# Patient Record
Sex: Male | Born: 1935 | Race: White | Hispanic: No | Marital: Married
Health system: Southern US, Community
[De-identification: ages and names within clinical notes are randomized; demographics above are authoritative.]

---

## 2004-02-28 ENCOUNTER — Emergency Department: Payer: Self-pay | Admitting: Emergency Medicine

## 2004-03-27 ENCOUNTER — Other Ambulatory Visit: Payer: Self-pay

## 2004-03-27 ENCOUNTER — Emergency Department: Payer: Self-pay | Admitting: Internal Medicine

## 2004-09-28 ENCOUNTER — Ambulatory Visit: Payer: Self-pay | Admitting: Unknown Physician Specialty

## 2005-06-28 ENCOUNTER — Emergency Department: Payer: Self-pay | Admitting: Emergency Medicine

## 2006-02-25 ENCOUNTER — Other Ambulatory Visit: Payer: Self-pay

## 2006-02-25 ENCOUNTER — Inpatient Hospital Stay: Payer: Self-pay | Admitting: General Surgery

## 2006-03-19 ENCOUNTER — Emergency Department: Payer: Self-pay | Admitting: Emergency Medicine

## 2006-03-19 ENCOUNTER — Other Ambulatory Visit: Payer: Self-pay

## 2006-04-07 ENCOUNTER — Ambulatory Visit: Payer: Self-pay | Admitting: General Surgery

## 2007-07-26 IMAGING — CT CT ABD-PELV W/ CM
1 of 3 series · 13 of 32 positions shown, 19 images · non-contrast
Comparison: none

REASON FOR EXAM: LLQ pain
COMMENTS:

PROCEDURE:     CT  - CT ABDOMEN / PELVIS  W  - March 20, 2006 [DATE]
RESULT:     The patient is experiencing LEFT lower quadrant pain.
The report was faxed to the Emergency Room.

[Series 2: abd/pelvis · axial · 0.92mm/px · z∈[-488,-68]mm · 13 of 164 slices shown, 19 images]
[im 12/164  soft-tissue]
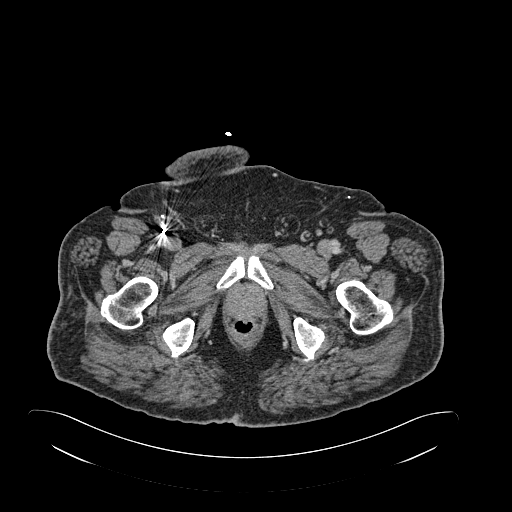
[im 12/164  bone]
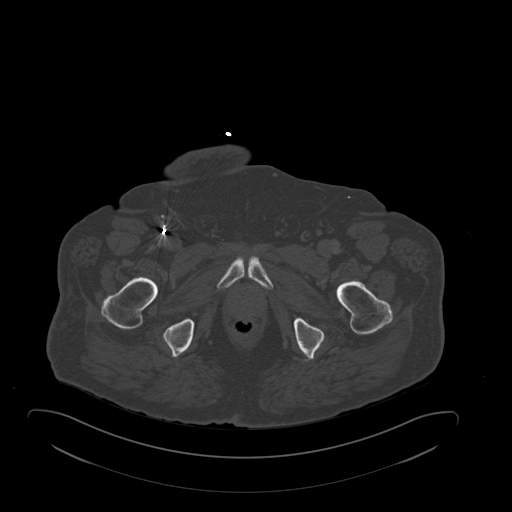
[im 24/164  soft-tissue]
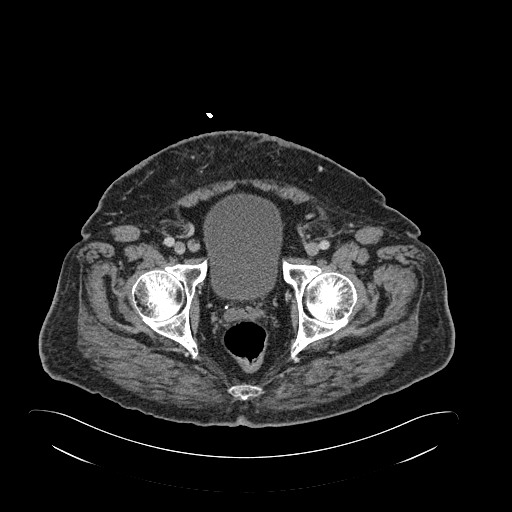
[im 35/164  soft-tissue]
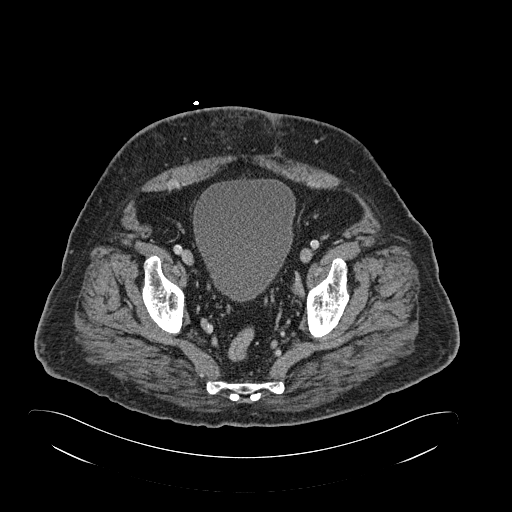
[im 47/164  soft-tissue]
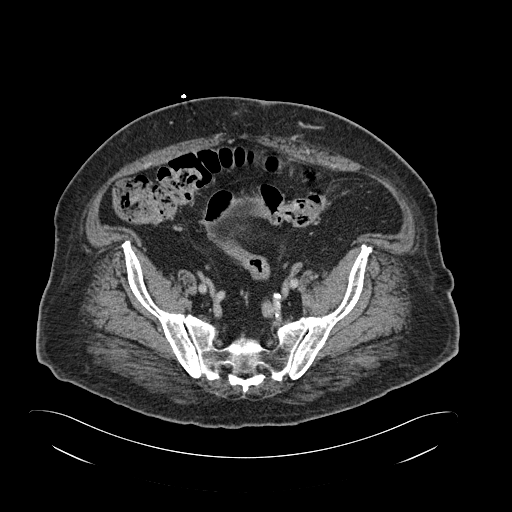
[im 59/164  soft-tissue]
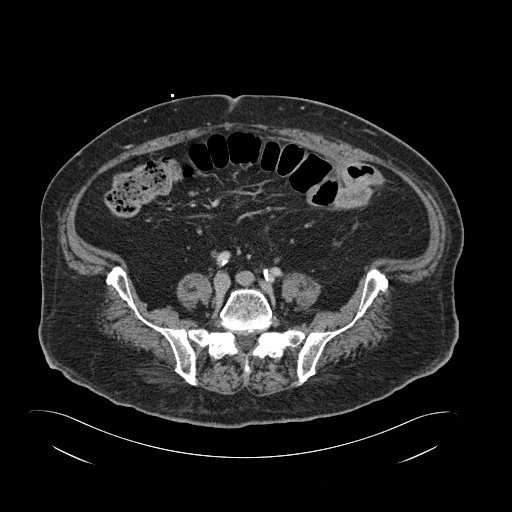
[im 70/164  soft-tissue]
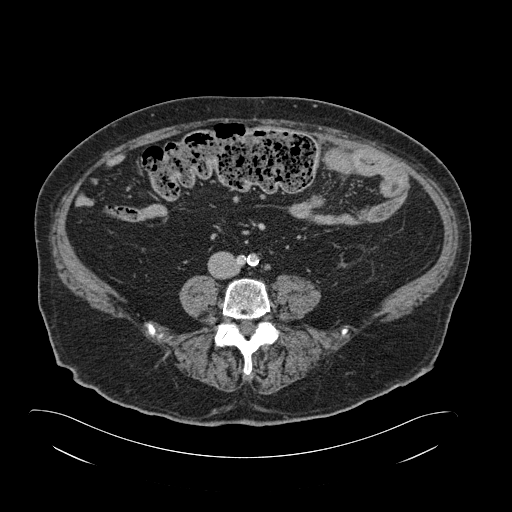
[im 82/164  soft-tissue]
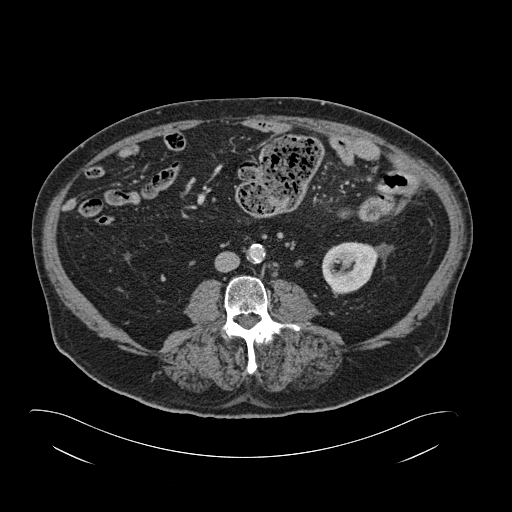
[im 94/164  soft-tissue]
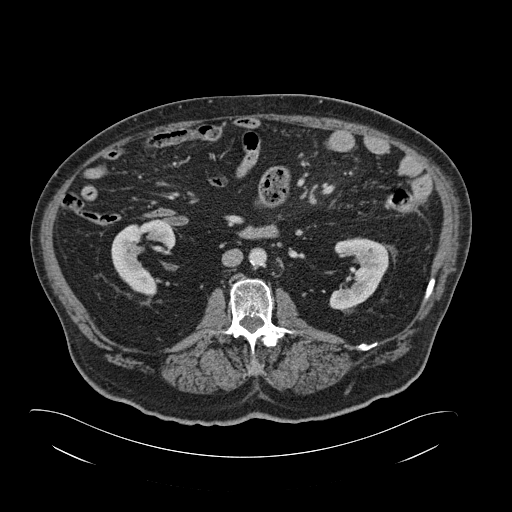
[im 105/164  soft-tissue]
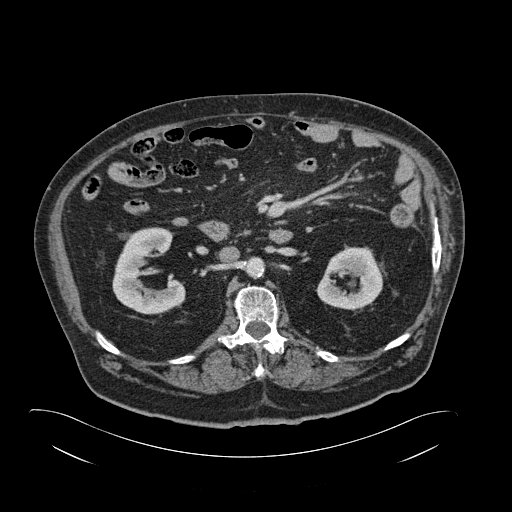
[im 105/164  bone]
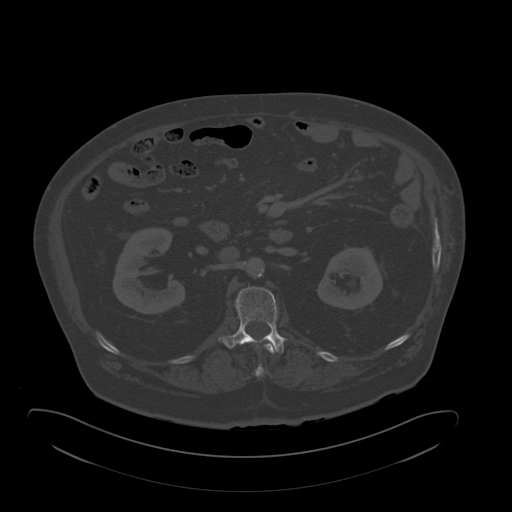
[im 117/164  soft-tissue]
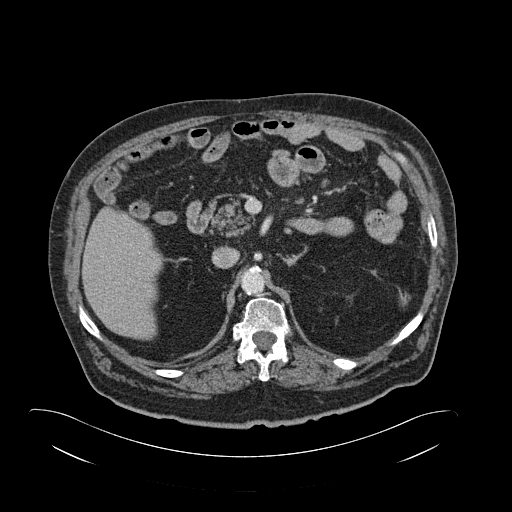
[im 117/164  lung]
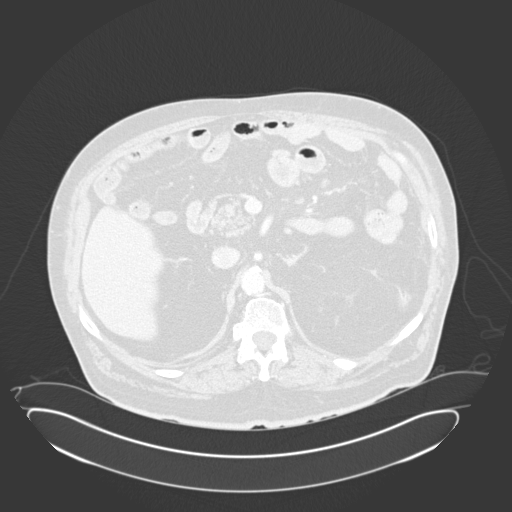
[im 129/164  soft-tissue]
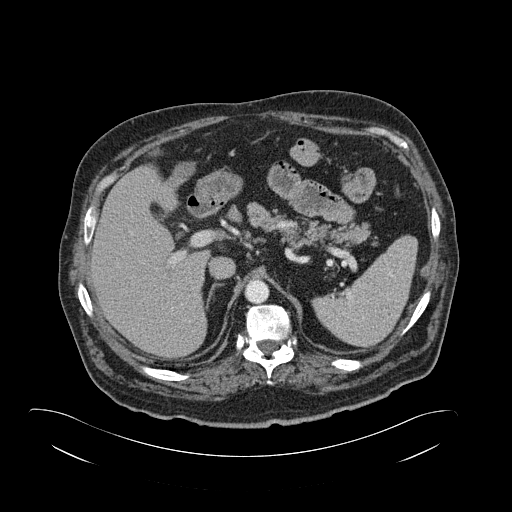
[im 129/164  lung]
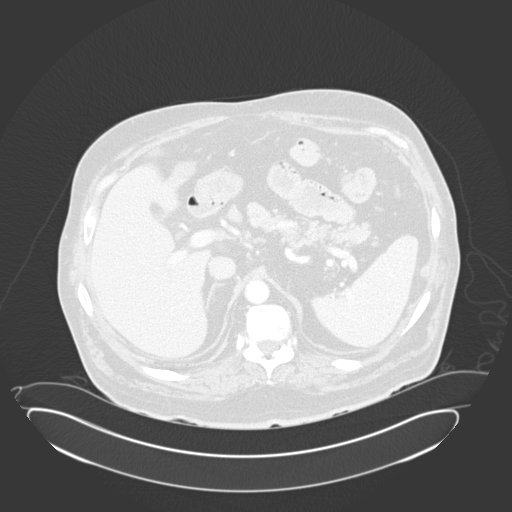
[im 140/164  soft-tissue]
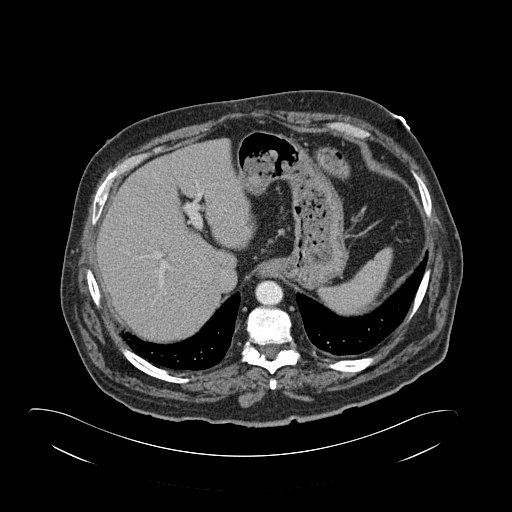
[im 140/164  lung]
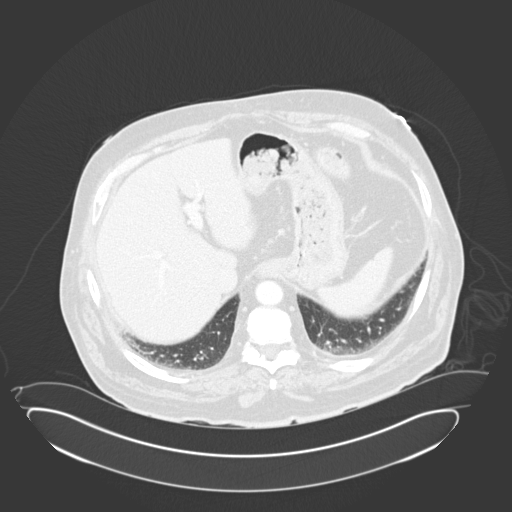
[im 152/164  soft-tissue]
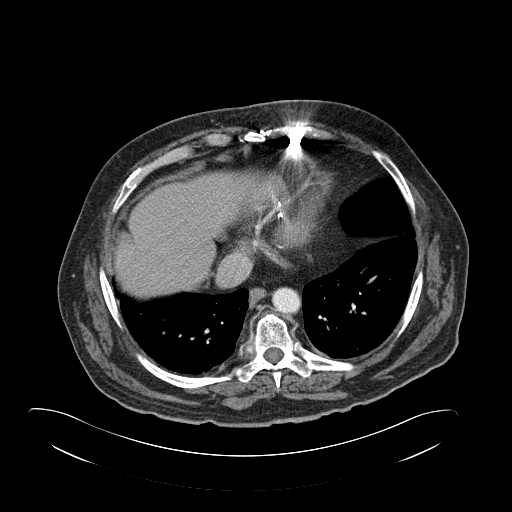
[im 152/164  lung]
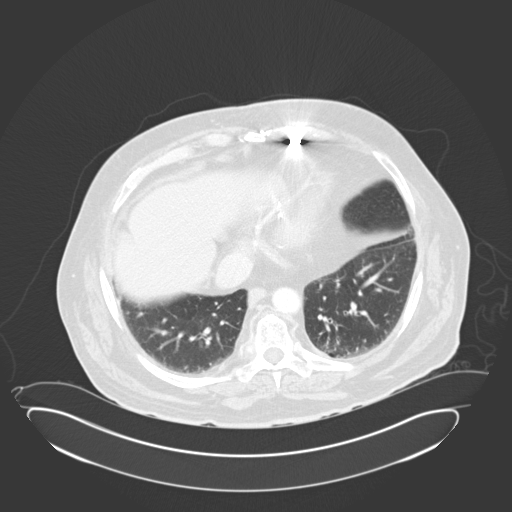

[13 of 32 positions shown; findings below may reference images not displayed]

FINDINGS: On today's study when compared to 03/02/2006, no intra-abdominal
abscesses are identified. There are noted gallstones as seen previously. The
liver and spleen appear intact. No focal masses are noted. Both kidneys
excrete the contrast material with no masses noted in either kidney. No
hydronephrosis is identified. There are noted two, tiny cysts in the lower
pole of the RIGHT kidney. No ascites is present in the abdomen or pelvis.

The images through the lung bases reveal the lung bases to be clear with no
effusions present.
IMPRESSION: 1.     No intra-abdominal abscesses are identified when compared to
03/02/2006.
2.     No definite diverticulitis or appendicitis is noted.
3.     Incidentally noted, gallstones are present.

## 2007-12-07 ENCOUNTER — Emergency Department: Payer: Self-pay | Admitting: Unknown Physician Specialty

## 2009-03-15 ENCOUNTER — Inpatient Hospital Stay: Payer: Self-pay | Admitting: Student

## 2009-06-03 ENCOUNTER — Emergency Department: Payer: Self-pay | Admitting: Emergency Medicine

## 2010-02-26 ENCOUNTER — Ambulatory Visit: Payer: Self-pay | Admitting: Geriatric Medicine

## 2010-07-30 ENCOUNTER — Inpatient Hospital Stay: Payer: Self-pay | Admitting: Internal Medicine

## 2010-12-25 ENCOUNTER — Ambulatory Visit: Payer: Self-pay | Admitting: Geriatric Medicine

## 2011-01-18 ENCOUNTER — Ambulatory Visit: Payer: Self-pay | Admitting: Internal Medicine

## 2011-02-13 ENCOUNTER — Other Ambulatory Visit: Payer: Self-pay | Admitting: Geriatric Medicine

## 2011-02-14 ENCOUNTER — Inpatient Hospital Stay: Payer: Self-pay | Admitting: Internal Medicine

## 2011-02-18 ENCOUNTER — Ambulatory Visit: Payer: Self-pay | Admitting: Internal Medicine

## 2011-02-18 LAB — CBC WITH DIFFERENTIAL/PLATELET
Basophil #: 0 10*3/uL (ref 0.0–0.1)
Basophil %: 0 %
Eosinophil #: 0.4 10*3/uL (ref 0.0–0.7)
Eosinophil %: 2.7 %
HCT: 33.5 % — ABNORMAL LOW (ref 40.0–52.0)
HGB: 10.7 g/dL — ABNORMAL LOW (ref 13.0–18.0)
Lymphocyte #: 0.8 10*3/uL — ABNORMAL LOW (ref 1.0–3.6)
Lymphocyte %: 5.5 %
MCH: 25.9 pg — ABNORMAL LOW (ref 26.0–34.0)
MCHC: 31.9 g/dL — ABNORMAL LOW (ref 32.0–36.0)
MCV: 81 fL (ref 80–100)
Monocyte #: 0.6 10*3/uL (ref 0.0–0.7)
Monocyte %: 4.1 %
Neutrophil #: 12.7 10*3/uL — ABNORMAL HIGH (ref 1.4–6.5)
Neutrophil %: 87.7 %
Platelet: 279 10*3/uL (ref 150–440)
RBC: 4.11 10*6/uL — ABNORMAL LOW (ref 4.40–5.90)
RDW: 20.5 % — ABNORMAL HIGH (ref 11.5–14.5)
WBC: 14.5 10*3/uL — ABNORMAL HIGH (ref 3.8–10.6)

## 2011-02-18 LAB — BASIC METABOLIC PANEL
Anion Gap: 9 (ref 7–16)
BUN: 22 mg/dL — ABNORMAL HIGH (ref 7–18)
EGFR (Non-African Amer.): >60
Glucose: 293 mg/dL — ABNORMAL HIGH (ref 65–99)
Osmolality: 308 (ref 275–301)
Potassium: 3.8 mmol/L (ref 3.5–5.1)
Sodium: 148 mmol/L — ABNORMAL HIGH (ref 136–145)

## 2011-02-18 LAB — HEPATIC FUNCTION PANEL A (ARMC)
Alkaline Phosphatase: 78 U/L (ref 50–136)
Bilirubin,Total: 0.4 mg/dL (ref 0.2–1.0)
SGPT (ALT): 40 U/L
Total Protein: 5 g/dL — ABNORMAL LOW (ref 6.4–8.2)

## 2011-02-18 LAB — SODIUM
Sodium: 148 mmol/L — ABNORMAL HIGH (ref 136–145)
Sodium: 149 mmol/L — ABNORMAL HIGH (ref 136–145)

## 2011-02-19 LAB — COMPREHENSIVE METABOLIC PANEL
Alkaline Phosphatase: 77 U/L (ref 50–136)
BUN: 19 mg/dL — ABNORMAL HIGH (ref 7–18)
Bilirubin,Total: 0.5 mg/dL (ref 0.2–1.0)
Co2: 30 mmol/L (ref 21–32)
Creatinine: 1.02 mg/dL (ref 0.60–1.30)
EGFR (African American): >60
EGFR (Non-African Amer.): >60
Glucose: 125 mg/dL — ABNORMAL HIGH (ref 65–99)
Osmolality: 296 (ref 275–301)
Potassium: 3.7 mmol/L (ref 3.5–5.1)
SGOT(AST): 50 U/L — ABNORMAL HIGH (ref 15–37)
SGPT (ALT): 43 U/L
Total Protein: 5.2 g/dL — ABNORMAL LOW (ref 6.4–8.2)

## 2011-02-19 LAB — CBC WITH DIFFERENTIAL/PLATELET
Basophil #: 0 10*3/uL (ref 0.0–0.1)
Basophil %: 0 %
HCT: 33.6 % — ABNORMAL LOW (ref 40.0–52.0)
Lymphocyte #: 1.1 10*3/uL (ref 1.0–3.6)
MCH: 25.6 pg — ABNORMAL LOW (ref 26.0–34.0)
MCHC: 31.3 g/dL — ABNORMAL LOW (ref 32.0–36.0)
MCV: 82 fL (ref 80–100)
Monocyte %: 3.8 %
Neutrophil #: 14.3 10*3/uL — ABNORMAL HIGH (ref 1.4–6.5)
Neutrophil %: 85.6 %
Platelet: 285 10*3/uL (ref 150–440)
RDW: 21.5 % — ABNORMAL HIGH (ref 11.5–14.5)
WBC: 16.7 10*3/uL — ABNORMAL HIGH (ref 3.8–10.6)

## 2011-02-19 LAB — SODIUM
Sodium: 152 mmol/L — ABNORMAL HIGH (ref 136–145)
Sodium: 149 mmol/L — ABNORMAL HIGH (ref 136–145)

## 2011-02-20 LAB — SODIUM: Sodium: 152 mmol/L — ABNORMAL HIGH (ref 136–145)

## 2011-02-21 LAB — CBC WITH DIFFERENTIAL/PLATELET
Basophil #: 0 10*3/uL (ref 0.0–0.1)
Basophil %: 0.3 %
Eosinophil #: 0.4 10*3/uL (ref 0.0–0.7)
HGB: 10.9 g/dL — ABNORMAL LOW (ref 13.0–18.0)
Lymphocyte %: 9.9 %
MCHC: 31.8 g/dL — ABNORMAL LOW (ref 32.0–36.0)
MCV: 82 fL (ref 80–100)
Monocyte #: 0.8 10*3/uL — ABNORMAL HIGH (ref 0.0–0.7)
Monocyte %: 7.2 %
Neutrophil #: 9.3 10*3/uL — ABNORMAL HIGH (ref 1.4–6.5)
Neutrophil %: 79.2 %
RDW: 21.6 % — ABNORMAL HIGH (ref 11.5–14.5)

## 2011-03-21 ENCOUNTER — Ambulatory Visit: Payer: Self-pay | Admitting: Internal Medicine

## 2011-04-12 ENCOUNTER — Inpatient Hospital Stay: Payer: Self-pay | Admitting: Internal Medicine

## 2011-04-12 LAB — CBC
HCT: 36.8 % — ABNORMAL LOW (ref 40.0–52.0)
HGB: 11.7 g/dL — ABNORMAL LOW (ref 13.0–18.0)
MCH: 27.3 pg (ref 26.0–34.0)
MCHC: 31.7 g/dL — ABNORMAL LOW (ref 32.0–36.0)
Platelet: 228 10*3/uL (ref 150–440)

## 2011-04-12 LAB — COMPREHENSIVE METABOLIC PANEL
Albumin: 2.9 g/dL — ABNORMAL LOW (ref 3.4–5.0)
Alkaline Phosphatase: 68 U/L (ref 50–136)
Anion Gap: 9 (ref 7–16)
Bilirubin,Total: 0.3 mg/dL (ref 0.2–1.0)
Calcium, Total: 8.6 mg/dL (ref 8.5–10.1)
Co2: 35 mmol/L — ABNORMAL HIGH (ref 21–32)
Glucose: 116 mg/dL — ABNORMAL HIGH (ref 65–99)
Osmolality: 288 (ref 275–301)
SGOT(AST): 20 U/L (ref 15–37)
SGPT (ALT): 14 U/L

## 2011-04-12 LAB — CK TOTAL AND CKMB (NOT AT ARMC)
CK, Total: 42 U/L (ref 35–232)
CK, Total: 40 U/L (ref 35–232)
CK-MB: 1.5 ng/mL (ref 0.5–3.6)
CK-MB: 1.3 ng/mL (ref 0.5–3.6)

## 2011-04-12 LAB — TROPONIN I
Troponin-I: 0.1 ng/mL — ABNORMAL HIGH
Troponin-I: 0.09 ng/mL — ABNORMAL HIGH
Troponin-I: 0.05 ng/mL

## 2011-04-12 LAB — PRO B NATRIURETIC PEPTIDE: B-Type Natriuretic Peptide: 2117 pg/mL — ABNORMAL HIGH (ref 0–450)

## 2011-04-13 LAB — BASIC METABOLIC PANEL
BUN: 19 mg/dL — ABNORMAL HIGH (ref 7–18)
Chloride: 92 mmol/L — ABNORMAL LOW (ref 98–107)
EGFR (African American): >60
EGFR (Non-African Amer.): >60
Glucose: 155 mg/dL — ABNORMAL HIGH (ref 65–99)
Osmolality: 279 (ref 275–301)
Potassium: 3.6 mmol/L (ref 3.5–5.1)
Sodium: 137 mmol/L (ref 136–145)

## 2011-04-13 LAB — PRO B NATRIURETIC PEPTIDE: B-Type Natriuretic Peptide: 3787 pg/mL — ABNORMAL HIGH (ref 0–450)

## 2011-04-13 LAB — LIPID PANEL
Cholesterol: 157 mg/dL (ref 0–200)
Ldl Cholesterol, Calc: 109 mg/dL — ABNORMAL HIGH (ref 0–100)
VLDL Cholesterol, Calc: 19 mg/dL (ref 5–40)

## 2011-04-13 LAB — HEMOGLOBIN A1C: Hemoglobin A1C: 7.2 % — ABNORMAL HIGH (ref 4.2–6.3)

## 2011-04-14 LAB — BASIC METABOLIC PANEL
Anion Gap: 10 (ref 7–16)
BUN: 20 mg/dL — ABNORMAL HIGH (ref 7–18)
Calcium, Total: 8.8 mg/dL (ref 8.5–10.1)
Co2: 38 mmol/L — ABNORMAL HIGH (ref 21–32)
EGFR (African American): >60
EGFR (Non-African Amer.): >60
Glucose: 97 mg/dL (ref 65–99)
Potassium: 3.2 mmol/L — ABNORMAL LOW (ref 3.5–5.1)
Sodium: 140 mmol/L (ref 136–145)

## 2011-04-16 LAB — POTASSIUM: Potassium: 3.7 mmol/L (ref 3.5–5.1)

## 2011-04-17 LAB — CULTURE, BLOOD (SINGLE)

## 2011-04-18 ENCOUNTER — Ambulatory Visit: Payer: Self-pay | Admitting: Internal Medicine

## 2011-07-19 DEATH — deceased

## 2014-06-11 NOTE — Consult Note (Signed)
PATIENT NAME:  Riley [name], Riley [name] MR#:  [id number] DATE OF BIRTH:  [birth year]  DATE OF CONSULTATION:  02/16/2011  CONSULTING PHYSICIAN:  Claude Manges, MD  REASON FOR CONSULTATION: Gallstones.   HISTORY OF PRESENT ILLNESS: Riley [name] is a 81 year old white male who suffers from a severe mentally handicapped state and schizophrenia, who was admitted to the hospital two days ago for what appears to be a pneumonia in the left lower lobe associated with hypotension, worsening renal failure, and significant leukocytosis. He was found to have gallstones on CT scan, and therefore I was consulted. Initial systolic blood pressure was 80, and this responded to fluid; and it was initially believed that he may have a urinary tract infection. However, urine culture is no growth after 36 hours. He also has two blood cultures that are no growth after 48 hours. His pneumonia has been treated with Zyvox and Zosyn until goals of care can be determined as he lives in a nursing home and his family has been difficult to contact. White blood cell count has been 24,000 and decreased to 21,000 today. His SGOT was 70 on admission and SGPT was 118 on admission, but bilirubin and alkaline phosphatase were both normal. Today his SGOT is down to 92, and his SGPT is 73. His bilirubin remains normal at 0.4. Alkaline phosphatase remains normal at 94, and his albumin is markedly diminished at 1.8. CT scan of the chest without contrast was obtained on admission on the 28th, and this showed left lower lobe pneumonia as well as cholelithiasis. Ultrasound of the gallbladder today showed gallstones with a normal common bile duct (3 mm), no gallbladder wall thickening, pericholecystic fluid, or sonographic Murphy's sign. Thus, there was no evidence of acute cholecystitis.   PAST MEDICAL HISTORY:  1. Coronary artery disease, status post myocardial infarction and coronary artery bypass graft.  2. Hypertension.  3. Dyslipidemia.  4. Diabetes.   5. Schizophrenia.  6. Severe mentally handicapped state.  7. Dysphagia.  8. Skin cancer.  9. Peripheral edema.  10. Gastroesophageal reflux disease.  11. Moderate-to-severe aortic stenosis.  12. Severe lumbar stenosis.  13. History of partial small bowel obstruction.   ALLERGIES: None.   CURRENT MEDICATIONS: Tylenol, Percocet, DuoNebs, aspirin, Rocephin, chlorpromazine, Plavix, codeine, dextromethorphan, guaifenesin, phenylephrine, diazepam, Lasix, glipizide, Imdur, metformin, nitroglycerin, prednisone, verapamil.   SOCIAL HISTORY: The patient resides at [facility] He does not smoke cigarettes, use alcohol, or any drugs, according to the medical record.   FAMILY HISTORY: Noncontributory.   REVIEW OF SYSTEMS: Review of systems cannot be obtained as the patient is nonverbal.   PHYSICAL EXAMINATION:  GENERAL: An obtunded elderly gentleman lying in the bed with multiple small ulcers about his face, and his mouth open and unresponsive. Height 5 feet 8 inches, weight 174 pounds, BMI 26.5.   VITAL SIGNS: Temperature 97.6, pulse 76, respirations 18, blood pressure 110/62, oxygen saturation 97% on room air.   HEENT: Normocephalic, atraumatic. His oral mucous membranes are extremely dry. His pupils are equal and reactive.   NECK: Supple with no jugular venous distention. The trachea is midline, and there is no thyroid tenderness.   HEART: Regular rate and rhythm with no murmurs or rubs.   LUNGS: Clear to auscultation with normal respiratory effort bilaterally.   ABDOMEN: Slightly protuberant but not distended, with positive bowel sounds and no tenderness, peritoneal signs, or hepatosplenomegaly.   EXTREMITIES: Significant lower extremity edema with delayed capillary refill.   NEUROLOGIC: Examination cannot be performed, but the  patient does appear to be able to move all four extremities.   PSYCHIATRIC: Examination cannot be performed as the patient is nonverbal.   ASSESSMENT:  Asymptomatic cholelithiasis with no indication of acute cholecystitis or symptoms from his gallstones.   PLAN: Given that the patient has severe or at least moderate aortic stenosis and takes Plavix and aspirin chronically, his Plavix would have to be discontinued for at least five days (preferably seven), and his aspirin would have to be discontinued for seven days; and he would require a Cardiology clearance for cholecystectomy due to his aortic stenosis. I do not believe that the patient's gallbladder is contributing to any of his symptoms and believe that the best course of action would be to establish goals of care with the assistance of the Palliative Care consult that you have already obtained. It appears that the risk of cholecystectomy would outweigh the benefits in this patient.   Thank you very much for this consultation.   ____________________________ Claude MangesWilliam F. Nayra Coury, MD wfm:cbb D: 02/16/2011 14:23:53 ET T: 02/16/2011 14:49:11 ET JOB#: [id number]  cc: Claude MangesWilliam F. Yides Saidi, MD, <Dictator> Claude MangesWILLIAM F Rayshell Goecke MD ELECTRONICALLY SIGNED 02/19/2011 14:18

## 2014-06-11 NOTE — Discharge Summary (Signed)
PATIENT NAME:  Riley [name], Riley [name] MR#:  [id number] DATE OF BIRTH:  01/16/1933  DATE OF ADMISSION:  2011-02-24 DATE OF DISCHARGE:  02/21/2011  ADMITTING DIAGNOSIS: Leukocytosis.   DISCHARGE DIAGNOSES:  1. Left lower lobe bacterial pneumonia of unclear etiology at this time.  2. Leukocytosis likely due to bacterial pneumonia, resolving.  3. Elevated transaminases, resolved, of unclear etiology.  4. Cholelithiasis, no acute cholecystitis.  5. Acute renal failure, likely dehydration related, resolved.  6. Dehydration.  7. Hyponatremia, hypokalemia.  8. Anemia.  9. Diabetes mellitus.  10. Severe protein calorie malnutrition.  11. Failure to thrive, adult.  12. Severe aortic stenosis.  13. Dysphagia.  14. Obesity.  15. History of coronary artery disease, status post myocardial infarction in the past, and bypass.  16. History of hypertension.  17. History of skin cancer.  18. History of lower extremity swelling. 19. Severe lumbar stenosis.  20. History of partial small bowel obstruction.  21. Schizophrenia.  22. Mentally handicapped.  23. History of hyperlipidemia.   DISCHARGE CONDITION: Guarded.   DISCHARGE MEDICATIONS: (The patient is to resume his outpatient medications which include the following) 1. Diazepam 5 mg p.o. twice daily. 2. Albuterol ipratropium 2.5/0.5 mg in 3 mL solution one vial, 3 mL, via nebulizer four times daily.  3. Chlorpromazine 25 mg twice daily.  4. Aspirin 81 mg p.o. daily.  5. Plavix 75 mg p.o. daily.  6. Glipizide 7.5 mg p.o. daily.  7. Sliding scale insulin.  8. Tylenol 750 mg p.o. every four hours as needed.  9. Protonix 40 mg p.o. daily.  10. Zofran 4 mg p.o. every six hours as needed.  11. Robitussin-AC 10 mL every four hours as needed.   HOME OXYGEN: None.   DIET: Pureed with thickened liquids, honey thick liquids. No straws. Medications in puree. Add gravy to foods to moisten and flavor. Tray set-up at meals and feedings.  ACTIVITY  LIMITATIONS: As tolerated.  REFERRALS: Hospice.  DISCHARGE FOLLOWUP: Followup with Dr. Glenetta Borg in 2 days after discharge.   CONSULTANTS: 1. Speech Therapy. 2. Ned Grace, MD - Care Management. 3. Duwaine Maxin, MD  RADIOLOGIC STUDIES: Chest x-ray one view on 02/24/11:  Findings suggesting atelectasis at lung bases. This may be superimposed upon findings of low-grade congestive heart failure. Follow-up PA and lateral would be of value, according to the radiologist.  CT scan of the chest without contrast on 02/24/2011: Small left pleural effusion. There is left lower lobe airspace disease which may represent atelectasis versus pneumonia. Cholelithiasis.   Ultrasound of kidneys bilaterally on Feb 24, 2011: Right renal cyst, otherwise normal renal ultrasound.  Ultrasound of abdomen, limited survey, on 02/16/2011: Cholelithiasis without sonographic evidence of acute cholecystitis.   Echocardiogram on 02/16/2011: Left ventricular systolic function is normal. Ejection fraction 55%. There is mild mitral regurgitation. There is mild tricuspid regurgitation and severe valvular aortic stenosis noted.   HISTORY OF PRESENT ILLNESS: The patient is a 81 year old Caucasian male with history of schizophrenia, history of diabetes, hypertension, coronary artery disease, and aortic stenosis who presented to the hospital from his facility because of leukocytosis. Apparently he was diagnosed with left-sided pneumonia and was started on antibiotics; however, he was noted to have also renal insufficiency as well as significant leukocytosis and because he was not doing better with antibiotic therapy for 24 hours, the decision was made to transfer him for further evaluation and management, to [facility]: [facility]   On arrival to the emergency room, the patient's vitals showed a pulse of  101, initial temperature was 96.8, respiration rate 20, blood pressure 90/62, and oxygen saturation was  94% on room air. He was also intermittently hypotensive with systolic blood pressures running around 70s to 80s to 90s. Physical examination showed bibasilar crackles.   LABS/STUDIES: B-type natriuretic peptide 2586. Glucose 352, BUN and creatinine 63 and 2.56, and sodium 155. The patient's liver enzymes showed elevation of AST as well as ALT to 86 and 92, respectively, and albumin level of 2.2. The patient's CK total was elevated at 791; however, MB fraction as well as troponin were within normal limits. White blood cell count was significantly elevated to 24.7, hemoglobin was 13.6, and platelets 289.   Blood cultures taken on 02/14/2011 showed no growth.   Influenza was also negative.   Urine cultures were also negative despite urine showing 38 white blood cells as well as 3 red blood cells and 2+ leukocyte esterase, as well as 1+ bacteria.   HOSPITAL COURSE: The patient was admitted to the hospital. Because of being in a facility, he was started on a combination of antibiotics with Zyvox as well as Zosyn. His chest x-ray done on 02/14/2011 showed atelectasis at lung bases as well as possible low-grade congestive heart failure.  In regards to bacterial pneumonia, as mentioned above, the patient was initiated on Zyvox as well as Zosyn IV. With this therapy his condition significantly improved. His white blood cell count improved to 11.8 on the day of discharge, 02/21/2011. The patient received seven days of antibiotic therapy with Zosyn and Zyvox for his pneumonia, which is targeted antibiotic course. The patient did not exhibit any signs however of pneumonia itself, no hypoxia, and no significant cough or shortness of breath. It was felt that the patient's pneumonia as well as leukocytosis resolved with antibiotic therapy.   The patient was noted to have elevated AST as well as ALT on admission. His CT showed cholelithiasis. Ultrasound of the abdomen also revealed cholelithiasis; however, no  cholecystitis. The patient was evaluated by surgeon, Dr. Anda KraftMarterre, who saw the patient in consultation on 02/16/2011. He felt however that the patient is at high risk for any kind of surgery including abdominal cholecystectomy especially in view of severe cardiac disease and if the patient needs therapy he would need to have Plavix as well as aspirin discontinued prior to surgery anyway. He did not feel in fact that the patient's gallbladder was contributing to any of his symptoms and recommended to get assistance from palliative care to establish goals of care. Dr. Harvie JuniorPhifer was consulted for palliative care and discussed the patient's care with his family. Because of lack of improvement in the patient's course, the decision was made to discharge the patient back to skilled nursing facility with hospice care.  Initially the patient was noted to have severe hyponatremia with sodium level of 155, as mentioned above, as well as acute renal failure. With IV fluid administration as well as holding his Lasix, the patient's kidney function improved. Already on 02/17/2011 the patient's creatinine was 1.25. However, the patient's sodium level remained still elevated and was very difficult to control despite D5 water given for him IV. The patient's sodium level fluctuated over a period of time. The lowest level seen here in the hospital was around 147. Today, on the day of discharge, 02/21/2011, the patient's sodium level is 152. It is recommended, if the patient's family desires, to follow the patient's sodium level as an outpatient. However, as the patient now will be under hospice care,  unlikely this would be feasible. It was felt that the patient's hyponatremia is dehydration related. However because of his diabetes mellitus leading him to dehydration as well as poor oral intake and dysphagia requiring thickened liquids, it will not be easy to achieve a normalized sodium level. Mover over the patient was volume overloaded  at some point requiring one dose of Lasix while in the hospital. Fluid overload was likely mild congestive heart failure related to severe aortic stenosis, diastolic dysfunction. The patient was evaluated by a dietitian while in the hospital and calorie count was obtained. The patient had less than 50% of his protein as well as calorie intake and it was felt that the patient is failing to thrive.   For diabetes mellitus, the patient's metformin was discontinued because of propensity to dehydration and acute renal failure. However, the patient is to continue glipizide at the same dose as he previously was taking as well as sliding scale insulin. The patient had discomfort in his abdomen apparently upon arrival to the hospital, however, he was not able to explain the discomfort and it was unclear if it was at all related to any gallstones or gastritis; however, the patient was initiated on Protonix and his discomfort resolved. He denied anymore significant problems with his abdomen during all his stay in the hospital time.   The patient is being discharged in stable condition with the above-mentioned medications and follow-up. His vitals on the day of discharge showed a temperature of 97.6, pulse 98, respiration rate 18, blood pressure 112/76, and saturation 98% on room air at rest.   TIME SPENT: 40 minutes. ____________________________ Katharina Caper, MD rv:slb D: 02/21/2011 13:51:00 ET T: 02/21/2011 14:39:26 ET JOB#: [id number]  cc: Katharina Caper, MD, <Dictator> Glenetta Borg, MD Dezi Brauner MD ELECTRONICALLY SIGNED 03/17/2011 7:28

## 2014-06-11 NOTE — Consult Note (Signed)
PATIENT NAME:  Riley [name], Riley [name] MR#:  [id number] DATE OF BIRTH:  07-07-1932  DATE OF CONSULTATION:  04/12/2011  REFERRING PHYSICIAN:  Dr. Maryellen [name]  CONSULTING PHYSICIAN:  Riley [name]. Riley Catala, MD  INDICATION: Shortness of breath and murmur, elevated troponins.   HISTORY OF PRESENT ILLNESS: Mr. Riley [name] is a 81 year old white male with history of multiple medical problems including coronary artery disease, severe aortic valve stenosis, congestive heart failure, diabetes, schizophrenia, hypertension who presented with worsening symptoms of shortness of breath. Patient also reports that he had some chest pain as well. He has been not communicating as well because he has a speech impediment but does indicate shortness of breath over the last five months or so. He was here back in December and got treated and evaluated. At that time was found to have severe aortic stenosis. He presents this time with recurrent congestive heart failure symptoms, decompensation with elevated troponins. Cardiology was recommended for further evaluation.   REVIEW OF SYSTEMS: No blackout spells. No syncope. No nausea, vomiting. Denies fever, chills, sweats. No weight loss, no weight gain. No hemoptysis, hematemesis. No bright red blood per rectum. He has had shortness of breath, fatigue, cough. No sputum.   PAST MEDICAL HISTORY:  1. Coronary artery disease. 2. Aortic valve stenosis. 3. Cholelithiasis.  4. Anemia.  5. Diabetes.  6. Failure to thrive. 7. Schizophrenia. 8. Hypertension.  9. Obesity.  10. Lumbar spine stenosis.  11. Partial bowel obstruction. 12. Hyperlipidemia. 13. Mental retardation.   PAST SURGICAL HISTORY: Coronary artery bypass surgery.   FAMILY HISTORY: Heart disease.   SOCIAL HISTORY: He lives in a nursing home. Remote history of smoking. No alcohol consumption.   MEDICATIONS:  1. Glipizide 7.5 daily.  2. Plavix 75 a day.  3. Oxygen 2 liters nasal cannula.  4. Zofran 4 mg every six hours p.r.n.   5. Delsym p.r.n.  6. Aspirin 81 mg a day. 7. Chlorpromazine 25 mg twice a day.  8. Valium 5 mg twice a day.  9. DuoNebs 4 times a day.  10. Protonix 40 mg a day. 11. Mucinex ER 600 twice a day.   PHYSICAL EXAMINATION:  VITAL SIGNS: Blood pressure 120/70, pulse 90 and regular, respiratory rate 24, afebrile.   HEENT: Normocephalic, atraumatic. Pupils equal, reactive to light.   NECK: Supple with mild bilateral jugular venous distention.   LUNGS: Bilateral rhonchi, rales in the bases. Adequate air movement. No wheezing.   HEART: Regular rate, rhythm. Loud systolic ejection murmur at 3/6 along the apex and left sternal border.   ABDOMEN: Benign.   EXTREMITIES: Within normal limits.  NEUROLOGICAL: Intact.   SKIN: Normal.   LABORATORY, DIAGNOSTIC AND RADIOLOGICAL DATA: Chest x-ray history of coronary artery bypass grafting with sternotomy, congestive heart failure. EKG: Sinus rhythm, rate of 90, nonspecific ST-T wave changes. Glucose 116. BNP 2117. BUN 13, creatinine 0.7, sodium 144, potassium 4, troponin elevated at 0.1. CK 42. LFTs are normal. CBC 9000, hemoglobin 11.7, hematocrit 36, platelet count 228.   ASSESSMENT:  1. Congestive heart failure. 2. Shortness of breath. 3. Aortic stenosis.  4. Elevated troponins, possible non-Q-wave myocardial infarction.  5. Anemia.  6. Hypertension.  7. Diabetes. 8. Coronary artery disease.  9. Spinal stenosis. 10. Obesity. 11. Schizophrenia.  12. Hyperlipidemia.  13. Failure to thrive.  PLAN: Agree with admit. Rule out for myocardial infarction. Follow up troponins. Follow up cardiac enzymes. Follow up EKG. Lasix therapy and oxygen therapy to help with volume. Repeat echocardiogram for aortic stenosis. Continue blood  pressure control. Continue with diabetes control. Agree with continue lipid management. Deep vein thrombosis prophylaxis. Do not recommend long-term anticoagulation at this point. Recommended conservative cardiology  approach. Do not recommend invasive catheterization or valve replacement because of comorbid disease.  ____________________________ Bobbie Stack. Juliann Pares, MD ddc:cms [name]: 04/15/2011 18:47:00 ET T: 04/16/2011 08:13:37 ET JOB#: [id number]  cc: Dani Wallner [name]. Juliann Pares, MD, <Dictator> Alwyn Pea MD ELECTRONICALLY SIGNED 05/06/2011 10:05

## 2014-06-11 NOTE — Consult Note (Signed)
Chief Complaint:   Subjective/Chief Complaint Pt states to be doing about the same . He states that his sob is better than when he was admitted. No cp.   VITAL SIGNS/ANCILLARY NOTES: **Vital Signs.:   25-Feb-13 18:02   Vital Signs Type Routine   Temperature Temperature (F) 97.6   Celsius 36.4   Temperature Source oral   Pulse Pulse 88   Pulse source per Telemetry Clerk   Respirations Respirations 20   Systolic BP Systolic BP 865   Diastolic BP (mmHg) Diastolic BP (mmHg) 68   Mean BP 80   BP Source Dinamap   Pulse Ox % Pulse Ox % 96   Pulse Ox Activity Level  At rest   Oxygen Delivery 2L  *Intake and Output.:   25-Feb-13 17:30   Grand Totals Intake:  360 Output:      Net:  360 24 Hr.:  1440   Oral Intake      In:  360   Percentage of Meal Eaten  100   Brief Assessment:   Cardiac Regular  murmur present  + LE edema  + JVD  --Gallop    Respiratory normal resp effort  rhonchi    Gastrointestinal Normal    Gastrointestinal details normal Soft  Nontender  Nondistended  Bowel sounds normal  No gaurding   Routine Chem:  25-Feb-13 01:48    Glucose, Serum 97   BUN 20   Creatinine (comp) 0.94   Sodium, Serum 140   Potassium, Serum 3.2   Chloride, Serum 92   CO2, Serum 38   Calcium (Total), Serum 8.8   Osmolality (calc) 282   eGFR (African American) >60   eGFR (Non-African American) >60   Anion Gap 10   Magnesium, Serum 1.8  Blood Glucose:  25-Feb-13 07:16    POCT Blood Glucose 140    11:05    POCT Blood Glucose 269    16:11    POCT Blood Glucose 212    19:33    POCT Blood Glucose 233   Radiology Results: XRay:    23-Feb-13 03:12, Chest Portable Single View   Chest Portable Single View    REASON FOR EXAM:    Shortness of Breath  COMMENTS:       PROCEDURE: DXR - DXR PORTABLE CHEST SINGLE VIEW  - Apr 12 2011  3:12AM     RESULT: Comparison: 02/14/2011    Findings:  The heart and mediastinum are stable. Prior median sternotomy and CABG.   There mild  basilar opacities, left greater than right. Possible small   left pleural effusion.    IMPRESSION:   1. Mild bibasilar opacities may be related to mild pulmonary edema or   atelectasis. Infection is not excluded. Followup chest radiographsare     suggested.  2. Small left pleural effusion.          Verified By: Gregor Hams, M.D., MD    25-Feb-13 08:00, Chest PA and Lateral   Chest PA and Lateral    REASON FOR EXAM:    f/u CHF/pul edema  COMMENTS:       PROCEDURE: DXR - DXR CHEST PA (OR AP) AND LATERAL  - Apr 14 2011  8:00AM     RESULT: Comparison is made to the prior exam of 04/12/2011. There is a   hazy increase in density at the left base consistent with a small left   pleural effusion. The pulmonary vascularity appears slightly less   prominent than was evident on  the exam of 04/12/2011. The heart is upper   limits for normal in size and appears stable in size as compared to the   prior exam. Postoperative changes of prior CABG are again noted.    IMPRESSION:   1. There is increased density at the left base suspicious for a trace   left effusion.  2. The lung fields otherwise are clear.  3. The heart is upper limits for normal in size but appears stable in   size as compared to the prior exam.  4. Postoperative changes of prior CABG are again noted.  5. In the lateral view, there is thickening of the left pleural shadow   posteriorly compatible with fibrosis or recurrent effusion and which was   also present on the exam of 07/31/2010.    Thank you for the opportunity to contribute to the care of your patient.           Verified By: Dionne Ano WALL, M.D., MD  Cardiology:    23-Feb-13 01:10, ED ECG   Ventricular Rate 97   Atrial Rate 97   P-R Interval 158   QRS Duration 86   QT 370   QTc 469   P Axis 23   R Axis -5   T Axis -85   ECG interpretation    Normal sinus rhythm  Minimal voltage criteria for LVH, may be normal variant  ST & T wave abnormality,  consider anterolateral ischemia  Prolonged QT  Abnormal ECG  When compared with ECG of 29-Jul-2010 19:50,  Non-specific change in ST segment in Anterior leads  Nonspecific T wave abnormality now evident in Inferior leads  T wave inversion now evident in Anterolateral leads  ----------unconfirmed----------  Confirmed by OVERREAD, NOT ([id number]), editor PEARSON, BARBARA ([id number]) on 04/14/2011 8:39:24 AM   ED ECG     23-Feb-13 15:28, Echo Doppler   Echo Doppler    Interpretation Summary    The left ventricle is moderately dilated. There is no thrombus. Left   ventricular systolic function is moderately reduced. Ejection   Fraction = 35-45%. There is normal left ventricular wall thickness.   There is moderateglobal hypokinesis of the left ventricle. The   right ventricle is not well visualized. Borderline right ventricular   enlargement. The right ventricular systolic function is mildly   reduced. Severe valvular aortic stenosis. Mild aortic regurgitation.    PatientHeight: 180 cm    PatientWeight: 102 kg    BSA: 2.2 m2    Procedure:    A two-dimensional transthoracic echocardiogram with color flow and   Doppler was performed.    The study was completed  bedside.    Left Ventricle    Left ventricularsystolic function is moderately reduced.    Ejection Fraction = 35-45%.    There is moderate global hypokinesis of the left ventricle.    The left ventricle is moderately dilated.    There is no thrombus.    There is normal left ventricular wall thickness.    Right Ventricle    The right ventricular systolic function is mildly reduced.    The right ventricle is not well visualized.    Borderline right ventricular enlargement.    There is normal right ventricular wall thickness.    Atria    Borderline left atrial enlargement.    Borderline right atrial enlargement.    Mitral Valve    The mitral valve leaflets appear thickened, but open well.    There is mild mitral  regurgitation.  Tricuspid Valve    The tricuspid valve is not well visualized, but is grossly normal.    There is mild to moderate tricuspid regurgitation.    Aortic Valve    Severe valvular aortic stenosis.    There is discrete nodular thickening of the non- coronary cusp.    Mild aortic regurgitation.    Pulmonic Valve    The pulmonic valve is not well visualized.    There is no pulmonic valvular regurgitation.    Great Vessels    The aortic root is not well visualized but is probably normal size.    Pericardium/Pleural    There is no pleural effusion.    No pericardial effusion.    MMode 2D Measurements and Calculations    IVSd: 1.6 cm    LVIDd: 4.9 cm    LVIDs: 3.3 cm    LVPWd: 1.4 cm    FS: 32 %    EF(Teich): 60 %    LVOT diam: 1.8 cm    LVLd ap4: 9.6 cm    EDV(MOD-sp4): 88 ml    LVLs ap4: 7.1 cm    ESV(MOD-sp4): 51 ml    EF(MOD-sp4): 42 %    SV(MOD-sp4): 37 ml    Doppler Measurements and Calculations    MV E point: 61 cm/sec    MV A point: 101 cm/sec    MV E/A: 0.61     MV P1/2t max vel: 82 cm/sec    MV P1/2t: 77 msec    MVA(P1/2t): 2.9 cm2    MV dec slope: 313 cm/sec2    MV dec time: 0.23 sec    Ao V2 max: 406 cm/sec    Ao max PG: 64 mmHg    Ao V2 mean: 285 cm/sec    Ao mean PG: 38 mmHg    Ao V2 VTI: 95 cm    AVA(I,D): 0.42 cm2    AVA(V,D): 0.51 cm2    AI max vel: 262 cm/sec    AI max PG: 27 mmHg    AI dec slope: 138 cm/sec2    AI P1/2t: 556 msec    LV max PG: 3.0 mmHg    LV mean PG: 1.1 mmHg    LV V1 max: 81 cm/sec    LV V1 mean: 46 cm/sec    LV V1 VTI: 16 cm    MR max vel: 549 cm/sec    MR max PG: 121 mmHg    SV(LVOT): 40 ml    PA V2 max: 125 cm/sec    PA max PG: 6.0 mmHg    PA acc time: 0.05 sec    PI end-d vel: 72 cm/sec    TR Max vel: 220 cm/sec    TR Max PG: 19 mmHg    RVSP: 24 mmHg    RAP systole: 5.0 mmHg    PA pr(Accel): 55 mmHg    Reading Physician: Lujean Amel   Sonographer: Jaci Standard  Interpreting Physician:   Lujean Amel,  electronically signed on   04-13-2011 16:53:59  Requesting Physician: Lujean Amel   Assessment/Plan:  Invasive Device Daily Assessment of Necessity:   Does the patient currently have any of the following indwelling devices? none   Assessment/Plan:   Assessment IMP CHF NQMI AS HTN DM CAD Schizophenia Weakness Murmur Fatigue .    Plan PLAN 02 Lasix Bp control DVT prophylasix Continue DM control F/U Ekgs PT/OT Conservative cardiology care Agree with palliative care I do not rec AovR We could consider Core valve study at [facility]?  Electronic Signatures: Lujean Amel D (MD)  (Signed [phone] 14:22)  Authored: Chief Complaint, VITAL SIGNS/ANCILLARY NOTES, Brief Assessment, Lab Results, Radiology Results, Assessment/Plan   Last Updated: 26-Feb-13 14:22 by Yolonda Kida (MD)

## 2014-06-11 NOTE — H&P (Signed)
PATIENT NAME:  Riley [name], LORENSON MR#:  [id number] DATE OF BIRTH:  10-Dec-1932  DATE OF ADMISSION:  02/14/2011  REFERRING PHYSICIAN: Dr. Marilynne Halsted.    PRIMARY CARE PHYSICIAN:  Glenetta Borg, MD  CHIEF COMPLAINT:  Leukocytosis.    HISTORY OF PRESENT ILLNESS: The patient is a 81 year old Caucasian male with history of coronary artery disease, status post MI and CABG, hypertension, hyperlipidemia, diabetes, schizophrenia, mentally handicapped who was transferred from [facility] today after persistent leukocytosis. Per records and staff, the patient was started on IV antibiotics yesterday for presumed left-sided pneumonia. He had acute renal insufficiency yesterday with creatinine of 1.8 as well as leukocytosis of 20,000. Today as he did not get better, and WBC still had trended up he was transferred here for further evaluation and management. Here he was found without fever; however, he appears to have dirty urine with positive leukocyte esterase and WBC in the urine. He is nonverbal and is unable to provide any history. I have called multiple family members, and I was unable to reach any of them. Thus, information is derived from the chart and ED staff.   Here he was given a dose of Levaquin x1. He had relative hypotension. Initial blood pressures were 80s to 90s, and he had a bout of systolic in the 60s. He had been only given 500 mL of IV fluids. Upon further IV fluids resuscitation of a 1/2 liter to 1 liter his last blood pressure is 84/55. Hospitalist service was contacted for further evaluation and management. He denies any pain. He is nonverbal and again is unable to provide history.   PAST MEDICAL HISTORY:  1. Coronary artery disease, status post myocardial infarction and bypass.   2. Hypertension.   3. Diabetes.   4. Skin cancer.  5. Dysphagia.  6. Edema in the legs, likely diastolic dysfunction.  7. GERD.   8. Moderate to severe aortic stenosis.   9. Severe lumbar stenosis.    10. History of partial SBO.   11. Schizophrenia.   12. Mentally handicapped.   13. Hyperlipidemia.   ALLERGIES: No known drug allergies.   MEDICATIONS:  1. Tylenol 625 mg p.r.n.  2. Acetaminophen/oxycodone 325/5 mg every six hours as needed.  3. DuoNeb 4 times a day.   4. Aspirin 81 mg daily.  5. Ceftriaxone was started today apparently, 1 gram per day.   6. Chlorpromazine 25 mg 2 times a day.  7. Plavix 75 mg daily.  8. Codeine/guaifenesin 10 mL 3 times a day as needed for cough.   9. Dextromethorphan guaifenesin/phenylephrine 4 times a day 15 mL.   10. Diazepam 5 mg 2 times a day.  11. Lasix 20 mg nightly.   12. Glipizide 7.5 mg daily.   13. Imdur 30 mg daily.  14. Metformin 1000 mg daily.   15. Nitroglycerin 0.4 mg sublingual every five minutes as needed for chest pain.   16. Prednisone started yesterday as a tapered fashion.   17. Verapamil 180 mg extended-release daily.   SOCIAL HISTORY: Lives in [facility]. No tobacco, alcohol or drug use per chart.    FAMILY HISTORY:  Father with heart disease.    REVIEW OF SYSTEMS: Unable to obtain secondary to patient being nonverbal.   PHYSICAL EXAMINATION:  VITALS: Initial temperature 96.8, pulse 101, respiratory rate 20, blood pressure 90/62, oxygen saturation 94% on room air, currently was 91% on room air. Lowest blood pressure was 67/51. Last blood pressure 84/55.   GENERAL: The patient is  an elderly Caucasian male lying on bed. No obvious distress, alert.   HEENT: Normocephalic, atraumatic. Very dry mucous membranes. Pupils are equal and reactive.   NECK: Supple. No JVD, no thyroid tenderness.   CARDIOVASCULAR: S1, S2, regular rate and rhythm. Systolic murmur in aortic region.   LUNGS: Basilar crackles bilaterally. Good air entry anterior fields.   ABDOMEN: Appears to slightly grimace on epigastric palpation, positive bowel sounds. No guarding or rebound. No peritoneal signs. No hepatomegaly noted.    LEGS:  No  significant edema.   NEUROLOGIC: Unable to do a full neurological examination; however, the patient can weakly move all of his extremities.   PSYCHIATRIC: He is awake, alert but nonverbal, flat affect.   LABORATORIES:  Glucose 352.  BNP 2586. BUN 63, yesterday was 42, creatinine 2.56, yesterday was 1.88. Sodium yesterday was 153, today is 155, potassium 3.9, chloride 114, albumin 2.2, AST 89 yesterday 70, ALT 92 yesterday 118. CK total 791. CK-MB 1.1, troponin 0.05.  WBC 24.7, hemoglobin 13.6, hematocrit 43.1, and platelets 389,000. Urinalysis is amber, 2+ blood, no nitrites, 2+ leukocyte esterase, 38 WBC, 1+ bacteria. X-ray of the chest, one view, showing lungs are reasonably inflated, interstitial markings are increased bilaterally. Cardiac silhouette is top normal in size and central pulmonary vascular is prominent suggesting atelectasis at the left lung base superimposed low-grade congestive heart failure.   ASSESSMENT AND PLAN:  We have a 81 year old Caucasian male from nursing home with schizophrenia, mentally handicapped, hypertension, coronary artery disease, diabetes, GERD, dysphagia, and likely diastolic dysfunction who is here for worsening leukocytosis, acute renal failure, relative hypotension, and elevated BNP. At this point the patient has been given Levaquin IV x1. The patient appears to have a dirty urine and likely suggesting of a urinary tract infection; however, x-ray of the chest does not show a pneumonia per the current read.  However, yesterday's x-ray per outpatient records showed a left-sided pneumonia. I would get a CAT scan of the chest stat. I would panculture the patient and start the patient on Zosyn as well as Zyvox renally dosed. He has received Levaquin dose x1 and given his renal dysfunction should be in the system for at least 48 hours. I would trend his leukocytosis. He has some mild hypoxia which possibly suggests congestive heart failure versus pneumonia. I would also  check an influenza. We would follow up with a CAT scan.   In regards to his elevated BNP, he has had an echo in the middle of this year showing ejection fraction preserved, likely diastolic dysfunction then. He also has moderate to severe aortic stenosis. He is relatively hypotensive, however appears to have responded to IV fluids. We would continue IV fluids and if he becomes hypotensive we would start pressors and transfer the patient to the Critical Care Unit. He has no fever currently. Would also resume oxygen.   In regards to his acute renal insufficiency, it appears to be prerenal and dehydration related as on physical exam he appears to be on the dry side with dry mucous membranes, poor skin turgor. We would start the patient on 1/2 normal saline given his hypernatremia, which likely is also secondary to poor p.o. intake and dehydration. We would get another BNP tonight. We would hold his blood pressure medicine as well as his oral diabetes medications and put him on sliding scale insulin.   For his coronary artery disease, we would resume his Plavix and aspirin and hold his blood pressure medicines.   TOTAL TIME  SPENT: 65 minutes.   CODE STATUS: The patient appears to be full code.    ____________________________ Krystal EatonShayiq Jalal Rauch, MD sa:vtd D: 02/14/2011 19:09:18 ET T: 02/15/2011 05:38:07 ET JOB#: [id number]  cc: Krystal EatonShayiq Casaundra Takacs, MD, <Dictator> Glenetta BorgMaria-Dorina Sevilla, MD Krystal EatonSHAYIQ Lalanya Rufener MD ELECTRONICALLY SIGNED 02/28/2011 20:37

## 2014-06-11 NOTE — H&P (Signed)
PATIENT NAME:  Riley [name], Riley [name] MR#:  [id number] DATE OF BIRTH:  [birth year]  DATE OF ADMISSION:  04/12/2011  PRIMARY CARE PHYSICIAN: Toy Cookey, MD  CHIEF COMPLAINT: Increased shortness of breath.   HISTORY OF PRESENT ILLNESS: Riley [name] is a 81 year old Caucasian male, nursing home resident, who was brought to the emergency department for evaluation of increased shortness of breath. The patient also reports that he had some chest pain. History taking is a little bit difficult because the patient does not provide adequate information except for the comments yes and no, but he does indicate that his shortness of breath was for five months and it got worse.  Evaluation here clinically is consistent with congestive heart failure and decompensation along with elevated troponin and significant elevation of BNP and abnormal EKG.  REVIEW OF SYSTEMS: CONSTITUTIONAL: No fever. No chills. EYES: Denies any blurring of vision or double vision. ENT: No hearing impairment. No sore throat. No dysphagia. CARDIOVASCULAR: Admits having chest pain and shortness of breath. He has also peripheral edema. RESPIRATORY: Reports cough and shortness of breath and also some chest pain. GASTROINTESTINAL: He says yes when I ask him about vomiting. No abdominal pain. No diarrhea. GENITOURINARY: Denies dysuria or frequency of urination. MUSCULOSKELETAL: No joint swelling or pain. No muscular pain or swelling. INTEGUMENTARY: No skin rash. No ulcers. NEUROLOGY: No focal weakness. No headache. No seizure activity. PSYCHIATRY: No anxiety or depression. Per  old records, he has history of schizophrenia. ENDOCRINE: No heat or cold intolerance. No night sweats.   PAST MEDICAL HISTORY:  1. History of coronary artery disease status post coronary artery bypass graft.  2. History of severe aortic valve stenosis. His last echocardiogram was in December 2012 showing ejection fraction of 55% and severe aortic stenosis.  3. History of  cholelithiasis.  4. History of anemia.  5. History of diabetes mellitus type 2.  6. History of failure to thrive.  7. History of schizophrenia. 8. History of hypertension. 9. Obesity. 10. Severe lumbar spine stenosis.  11. History of partial small bowel obstruction.  12. History of hyperlipidemia.  13. The patient is considered mentally handicapped.   SOCIAL HISTORY: The patient is a nursing home resident.   SOCIAL HABITS: Nonsmoker but reports remote history of smoking. No alcohol abuse.   FAMILY HISTORY: Per records his father had heart disease, age was not specified.   ADMISSION MEDICATIONS:  1. Glipizide 7.5 mg once a day. 2. Plavix 75 mg once a day. 3. Oxygen 2 liters nasal cannula. 4. Zofran 4 mg every 6 hours p.r.n. for nausea and vomiting. 5. Delsym suspension 10 mL every 12 hours as needed for cough.  6. Aspirin 81 mg a day. 7. Chlorpromazine 25 mg twice a day.  8. Valium 5 mg twice a day. 9. DuoNebs four times daily whenever needed.  10. Protonix 40 mg a day.  11. Mucinex ER 600 mg twice a day.   ALLERGIES: No known drug allergies.   PHYSICAL EXAMINATION:   VITAL SIGNS: Blood pressure 120/71, respiratory rate 24, pulse 92, temperature 98.2, oxygen saturation 100%. The patient is on oxygen here.  GENERAL APPEARANCE: Elderly male lying in bed, appears in mild respiratory difficulty.   HEAD/NECK: No cyanosis. There is mild pallor, no icterus.  EARS, NOSE, AND THROAT:  Hearing was normal. Nasal mucosa, lips, and tongue were normal.   EYES: Normal iris and conjunctivae. Pupils are about 4 to 5 mm, equal and reactive to light. There is squint in the eye.  NECK: Supple. No thyromegaly. No cervical lymphadenopathy. No masses.   HEART: Normal S1 and S2. Distant heart sounds. There is grade 2/3 systolic murmur at the apex and left sternal border No carotid bruits.   LUNGS: Mild tachypnea.  Few scattered rhonchi. I did not hear rales.   ABDOMEN: Obese and soft  without tenderness. No hepatosplenomegaly. No masses. No hernias.   MUSCULOSKELETAL: No joint swelling. No clubbing.   SKIN: No ulcers. No subcutaneous nodules. He has peripheral edema, left leg more than the right leg.  NEURO: The patient is alert and oriented to place and people. Cranial nerves II through XII are intact, except for the right eye squint. The patient is moving his upper extremities. He has weakness in his lower extremities, but able to lift his legs up off the bed level.   PSYCHIATRY: Alert and oriented to place and people. Mood and affect were normal.   LABS/STUDIES: Chest x-ray showed evidence of previous coronary artery bypass graft or sternotomy. There is haziness in the left costophrenic angle area either from previous scarring that was mentioned before or there is additional effusion in that area.   EKG showed normal sinus rhythm at rate of 97 per minute. ST-T wave abnormalities in leads V2 to V6. This is more prominent than his EKG in December.   Serum glucose 116. B-type natriuretic peptide 2117. BUN 13, creatinine 0.7, sodium 144, potassium 4. Troponin was elevated at 0.1. Total CPK 42. His liver function tests were normal, except for low albumin at 2.9. CBC showed white count of 9000 and hemoglobin 11.7 with hematocrit of 36. His hemoglobin is ranging between 10 to 13 in the last couple of years. Platelet count is 228.     ASSESSMENT:  1. Increased shortness of breath and evidence of volume overload and congestive heart failure. This needs to be further evaluated with an echocardiogram as there is a change since the last one which was in December of last year which showed normal left ventricular ejection fraction at 55%.  2. Chest pain and elevated troponin, and also EKG abnormality. Consider non-ST elevation acute myocardial infarction of the culprit of this presentation.  3. Severe aortic stenosis.  4. Anemia.  5. History of systemic hypertension.  6. Diabetes  mellitus type II.  7. History of coronary disease status post coronary artery bypass graft and history of myocardial infarction in the past.  8. History of severe lumbar spine stenosis.  9. History of obesity. 10. History of schizophrenia and mentally handicapped.  11. History of hyperlipidemia.  12. History of failure to thrive.  13. History of cholelithiasis.   PLAN: Admit the patient to the Intensive Care Unit and follow up on his cardiac enzymes. Continue antiplatelet therapy with Plavix and aspirin. Add cautiously small dose of beta blocker. IV diuresis with Lasix 40 mg every 8 hours. Once his volume overload is corrected, we need to change Lasix to p.o. Repeat basic metabolic profile and BMP tomorrow morning. Obtain echocardiogram. Cardiology consult for further input. For deep vein thrombosis prophylaxis, I placed the patient on Lovenox 40 mg subcutaneous once a day. For peptic ulcer disease prophylaxis, the patient is placed on  Protonix 40 mg a day.   I discussed the case with the ER physician and I also reviewed his medical records.  TIME NEEDED TO EVALUATE THE PATIENT: More than 55 minutes.  ____________________________ Carney CornersAmir M. Rudene Rearwish, MD amd:slb D: 04/12/2011 05:32:53 ET     T: 04/12/2011 08:46:27 ET  JOB#: [id number] cc: Carney Corners. Rudene Re, MD, <Dictator> Serita Sheller. Maryellen Pile, MD Zollie Scale MD ELECTRONICALLY SIGNED 04/13/2011 3:45

## 2014-06-11 NOTE — Discharge Summary (Signed)
PATIENT NAME:  Riley [name], Riley [name] MR#:  [id number] DATE OF BIRTH:  12-13-1932  DATE OF ADMISSION:  04/12/2011 DATE OF DISCHARGE:  04/17/2011  PRIMARY CARE PHYSICIAN: Dr. Maryellen PileEason  REASON FOR ADMISSION: Shortness of breath.    DISCHARGE DIAGNOSES:  1. Acute hypoxic respiratory failure which has now progressed to chronic respiratory failure likely due to congestive heart failure.  2. Congestive heart failure, acute on chronic systolic congestive heart failure exacerbation, ejection fraction 35% to 45%.  3. Severe aortic stenosis. Patient not a candidate for valve repair or replacement per cardiology. 4. Chest pain with elevated troponin. Elevated troponin felt to be demand ischemia from hypoxia and congestive heart failure rather than acute coronary syndrome and chest pain was felt to be secondary to congestive heart failure and possible pneumonia.  5. Coronary artery disease status post coronary artery bypass graft.  6. Possible pneumonia.  7. Hypertension.  8. Hyperlipidemia. 9. Type 2 diabetes mellitus. 10. Schizophrenia,  11. Mental handicap.  12. History of anemia.  13. History of severe lumbar spinal stenosis.   CODE STATUS: DO NOT RESUSCITATE.   CONSULTS:  1. Palliative care, Dr. Harvie JuniorPhifer.  2. Cardiology Dr. Juliann Paresallwood.   LABORATORY, DIAGNOSTIC AND RADIOLOGICAL DATA:  Portable chest x-ray 04/12/2011: Mild basilar opacities, may be related to mild pulmonary edema or atelectasis. Infection is not excluded. Small left pleural effusion.   Chest x-ray PA and lateral 04/14/2011: Increased density left base suspicious for trace left effusion. Lung fields otherwise clear. Heart is upper limits of normal in size but appears stable in size compared to prior examination.Marland Kitchen. Postoperative changes of CABG are noted again. Laterally thee is thickening of the left pleural shadow posteriorly compatible with fibrosis or recurrent effusion which is also present on exam of 07/31/2010.    2-[name] echocardiogram  04/12/2011: LV is moderately dilated. No thrombus. LV systolic function moderately reduced. Ejection fraction 35% to 45%. Moderate global hypokinesis of left ventricle. Right ventricle not well visualized. Borderline right ventricular enlargement. RV systolic function mildly reduced. Severe valvular aortic stenosis. Mild AR.   Blood cultures x2 from 04/12/2011: No growth to date.   Troponin on admission 0.10, second troponin 0.09, third troponin 0.05. CK and CK-MB levels were all normal.   BNP was 2117 on admission.  Renal function normal on admission with BUN 13, creatinine 0.71.   Hemoglobin A1c 7.2.   Lipid panel: Total cholesterol 157, triglycerides 96, HDL 29, LDL 109.   CBC on admission: WBC 9.5, hemoglobin 11.7, hematocrit 36.8, platelets 228.   BRIEF HISTORY/HOSPITAL COURSE: Patient is a 81 year old male with past medical history of hypertension, hyperlipidemia, type 2 diabetes mellitus, coronary artery disease status post coronary artery bypass graft, severe aortic stenosis, schizophrenia, mental handicap, failure to thrive, history of anemia. severe lumbar spinal stenosis presented to the Emergency Department with complaints of shortness of breath. Please see dictated admission history and physical for pertinent details surrounding the onset of this hospitalization. Please see below for further details.  1. Acute hypoxic respiratory failure-Patient was hypoxic on room air at the time of admission. Chest x-ray was suggestive of pulmonary edema and patient had concerns for volume overload. He was maintained on supplemental oxygen and this was titrated to keep his oxygen saturations greater than 90%. Chest x-ray was suggestive of congestive heart failure. He was started on IV diuresis with Lasix. Patient has diuresed a total of approximately 1400 mL since admission with Lasix with improvement of his shortness of breath, however, his hypoxia has not resolved  and his acute respiratory failure  has progressed to chronic and he has now been diagnosed with chronic respiratory failure and outpatient oxygen has been arranged for this patient. He was also maintained on beta blocker for congestive heart failure. He was not started on an ACE inhibitor given severe aortic stenosis and also borderline hypotension. There was also concern for possible pneumonia on chest x-ray. Blood cultures obtained. He was empirically started on IV antibiotics. There was a concern for him coughing and choking with feeding and ST evaluation was obtained and he was placed on aspiration precautions and he failed bedside swallow evaluation and has now been placed on dysphagia diet and he is at high risk for aspiration. His pneumonia noted on chest x-ray was probably aspiration in nature. He also had significant wheezing earlier but no prior diagnosis of chronic obstructive pulmonary disease but was given a course of steroids which was tapered orally and patient has now been tapered off all steroids. He does still have some coarse breath sounds but overall his lung aeration has improved. Blood cultures did not reveal any growth to date. In regards to congestive heart failure, repeat echocardiogram was obtained revealing moderate LV systolic dysfunction with ejection fraction 35% to 45% and severe aortic stenosis. Patient was seen in consultation by Dr. Juliann Pares of cardiology and Dr. Juliann Pares was in agreement with overall medical management plan with diuresis for management of his congestive heart failure and medical management of his coronary artery disease with medications and did not recommend any further cardiac diagnostics at this time. Dr. Juliann Pares does not feel that patient is a candidate for repair or replacement of his aortic stenosis. For congestive heart failure patient will continue Lasix and Coreg and not on an ACE inhibitor due to reasons mentioned above.  2. Chest pain with elevated troponin-Elevation of troponin was felt  to be from demand ischemia from hypoxia and congestive heart failure rather than acute coronary syndrome. His CK and CK-MB levels were within normal limits. Chest pain was felt to be from congestive heart failure and possible pneumonia rather than acute coronary syndrome. Full dose anticoagulation was deferred. He was maintained on aspirin, Plavix, Coreg and p.r.n. nitroglycerin therapy for his coronary artery disease and statin has also been added to his regimen. Cardiology recommends conservative management at this time and Dr. Juliann Pares does not recommend cardiac catheterization at this time. With overall measures during this hospitalization patient still continues to complain of cough as well as some shortness of breath and chest discomfort intermittently. He will be given pain control as well on supplemental oxygen. For possible pneumonia this is difficult to exclude on chest x-ray but there is no leukocytosis and he is afebrile but he may have aspirated and he has completed a week's course of antibiotics. He will be maintained on aspiration precautions as an outpatient.  3. Hypertension-Blood pressure some adjustments were made to patient's blood pressure medications. He will be taking Lasix and Coreg as an outpatient. Will need close outpatient follow up in terms of pressure management.  4. Hyperlipidemia-Which has failed diet control. LDL is 109. Given history of coronary artery disease he has been started on low dose statin. 5. Type 2 diabetes mellitus-Patient will continue glipizide and NovoLog sliding scale insulin. Hemoglobin A1c  is 7.2. He did have some elevated sugars during this hospitalization which is likely steroid induced and now that the steroids have been tapered his sugars have been improving. 6. Schizophrenia-Patient to continue Thorazine and Valium.  7.  Patient was seen by physical therapy prior to discharge and was felt to be globally weak and he was also seen by the palliative care  team and also hospice screening placement was obtained given that he is felt to be a poor overall prognosis given his age and multiple underlying comorbidities. He was felt to be appropriate for discharge to skilled nursing home with hospice services to follow which has been arranged for this patient. On 04/17/2011 patient was hemodynamically stable and felt to be safe for discharge to [facility] skilled nursing facility with hospice services and patient was agreeable to this plan.   DISCHARGE DISPOSITION: Skilled nursing facility/[facility] with hospice.   DISCHARGE CONDITION: Stable.   DISCHARGE ACTIVITY: As tolerated.   DISCHARGE DIET: Puree with honey consistency liquids, strict aspiration precautions, medications in puree, crushed as able. Tray set up at meals. Encourage eating slowly with small bites and sips. Patient must sit fully upright with all of his eating and drinking and meals.   MEDICATIONS:  1. Oxygen 2 liters per minute via nasal cannula continuously.  2. Diazepam 5 mg b.i.[name].  3. Chlorpromazine 25 mg b.i.[name].  4. Aspirin 81 mg daily. 5. Plavix 75 mg daily. 6. Protonix 40 mg daily.  7. Glipizide 2.5 mg t.i.[name].  8. NovoLog sliding insulin q.a.c. and at bedtime. See prescription for sliding scale.  9. Lipitor 10 mg at bedtime.  10. Nitrostat 0.4 mg sublingually every five minutes x3 p.r.n. chest pain.  11. Coreg 3.125 mg p.o. b.i.[name].  12. Lasix 40 mg p.o. b.i.[name].  13. Tussionex 5 mL p.o. q.12 hours p.r.n. pain. 14. Tessalon Perles 100 mg p.o. q.6 hours p.r.n. cough.  15. Senna-S 1 tab p.o. b.i.[name].  16. Percocet 5/325, 1 tab p.o. q.8 hours p.r.n. pain. 17. Tylenol 325 mg 1 to 2 tablets p.o. every 4 to 6 hours p.r.n. pain or fevers.   DISCHARGE INSTRUCTIONS:  1. Take medications as prescribed. 2. Return to Emergency Department for recurrence of symptoms.  FOLLOW UP INSTRUCTIONS: Follow up with house M.[name]. at the skilled nursing facility within 1 to 2 weeks. Patient  needs repeat blood pressure check within one week.  TIME SPENT ON DISCHARGE: Greater than 30 minutes.   ____________________________ Elon Alas, MD knl:cms [name]: 04/17/2011 11:29:47 ET T: 04/17/2011 11:48:50 ET JOB#: [id number]  cc: Elon Alas, MD, <Dictator> Serita Sheller Maryellen Pile, MD [facility]  [address] Arline Ketter MD ELECTRONICALLY SIGNED 04/23/2011 21:09
